# Patient Record
Sex: Female | Born: 1970 | Race: White | Hispanic: No | Marital: Married | Smoking: Former smoker
Health system: Southern US, Community
[De-identification: ages and names within clinical notes are randomized; demographics above are authoritative.]

## PROBLEM LIST (undated history)

## (undated) DIAGNOSIS — T18128A Food in esophagus causing other injury, initial encounter: Secondary | ICD-10-CM

## (undated) DIAGNOSIS — T7840XA Allergy, unspecified, initial encounter: Secondary | ICD-10-CM

## (undated) HISTORY — PX: BREAST BIOPSY: SHX20

## (undated) HISTORY — DX: Allergy, unspecified, initial encounter: T78.40XA

## (undated) HISTORY — PX: WISDOM TOOTH EXTRACTION: SHX21

## (undated) HISTORY — DX: Food in esophagus causing other injury, initial encounter: T18.128A

---

## 1999-04-22 ENCOUNTER — Other Ambulatory Visit: Admission: RE | Admit: 1999-04-22 | Discharge: 1999-04-22 | Payer: Self-pay | Admitting: Obstetrics and Gynecology

## 1999-08-07 ENCOUNTER — Other Ambulatory Visit: Admission: RE | Admit: 1999-08-07 | Discharge: 1999-08-07 | Payer: Self-pay | Admitting: Obstetrics & Gynecology

## 2000-02-26 ENCOUNTER — Inpatient Hospital Stay (HOSPITAL_COMMUNITY): Admission: AD | Admit: 2000-02-26 | Discharge: 2000-03-03 | Payer: Self-pay | Admitting: Obstetrics and Gynecology

## 2000-08-16 ENCOUNTER — Other Ambulatory Visit: Admission: RE | Admit: 2000-08-16 | Discharge: 2000-08-16 | Payer: Self-pay | Admitting: Obstetrics and Gynecology

## 2001-05-09 ENCOUNTER — Inpatient Hospital Stay (HOSPITAL_COMMUNITY): Admission: AD | Admit: 2001-05-09 | Discharge: 2001-05-12 | Payer: Self-pay | Admitting: Obstetrics and Gynecology

## 2001-06-01 ENCOUNTER — Encounter: Admission: RE | Admit: 2001-06-01 | Discharge: 2001-07-01 | Payer: Self-pay | Admitting: Obstetrics and Gynecology

## 2001-08-24 ENCOUNTER — Other Ambulatory Visit: Admission: RE | Admit: 2001-08-24 | Discharge: 2001-08-24 | Payer: Self-pay | Admitting: Obstetrics and Gynecology

## 2002-09-07 ENCOUNTER — Other Ambulatory Visit: Admission: RE | Admit: 2002-09-07 | Discharge: 2002-09-07 | Payer: Self-pay | Admitting: *Deleted

## 2003-10-29 ENCOUNTER — Other Ambulatory Visit: Admission: RE | Admit: 2003-10-29 | Discharge: 2003-10-29 | Payer: Self-pay | Admitting: Obstetrics and Gynecology

## 2005-03-23 ENCOUNTER — Other Ambulatory Visit: Admission: RE | Admit: 2005-03-23 | Discharge: 2005-03-23 | Payer: Self-pay | Admitting: Obstetrics and Gynecology

## 2006-05-24 ENCOUNTER — Other Ambulatory Visit: Admission: RE | Admit: 2006-05-24 | Discharge: 2006-05-24 | Payer: Self-pay | Admitting: Obstetrics and Gynecology

## 2008-03-02 DIAGNOSIS — D229 Melanocytic nevi, unspecified: Secondary | ICD-10-CM

## 2008-03-02 HISTORY — DX: Melanocytic nevi, unspecified: D22.9

## 2009-04-08 ENCOUNTER — Emergency Department (HOSPITAL_BASED_OUTPATIENT_CLINIC_OR_DEPARTMENT_OTHER): Admission: EM | Admit: 2009-04-08 | Discharge: 2009-04-08 | Payer: Self-pay | Admitting: Emergency Medicine

## 2009-04-08 ENCOUNTER — Ambulatory Visit: Payer: Self-pay | Admitting: Diagnostic Radiology

## 2010-08-22 IMAGING — CR DG FOOT COMPLETE 3+V*L*
3 series · 3 of 3 positions shown · non-contrast
Comparison: None

CLINICAL DATA: Left foot pain

LEFT FOOT - COMPLETE 3+ VIEW

[t foot ap left]
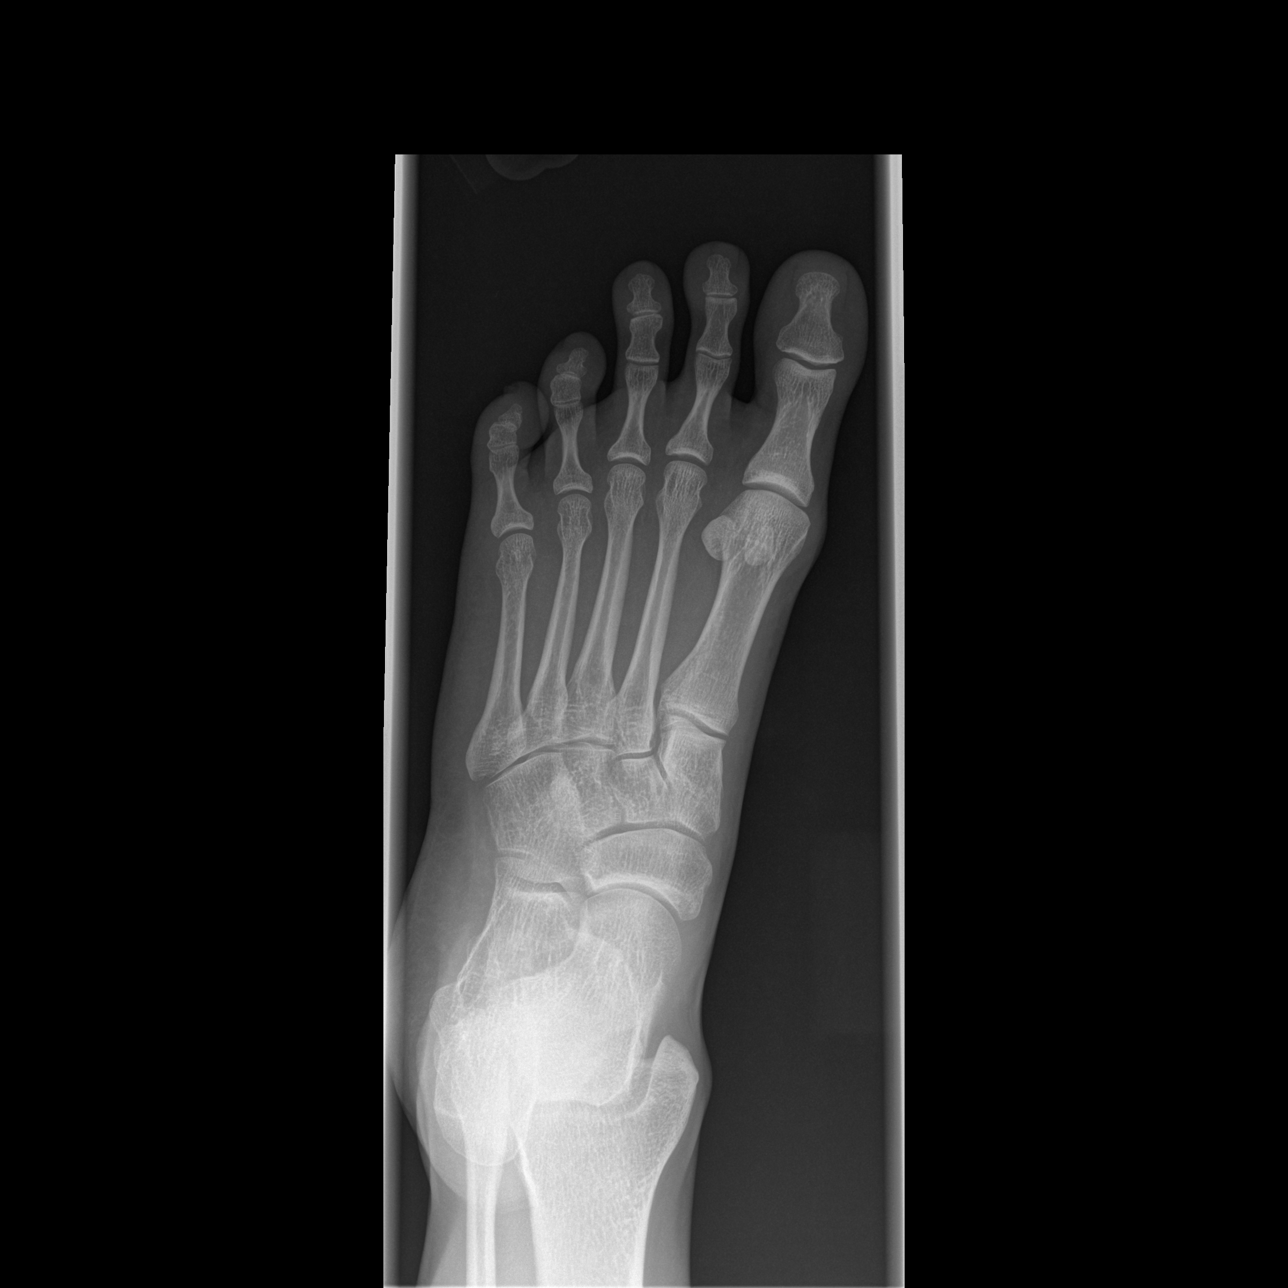

[t foot oblique left]
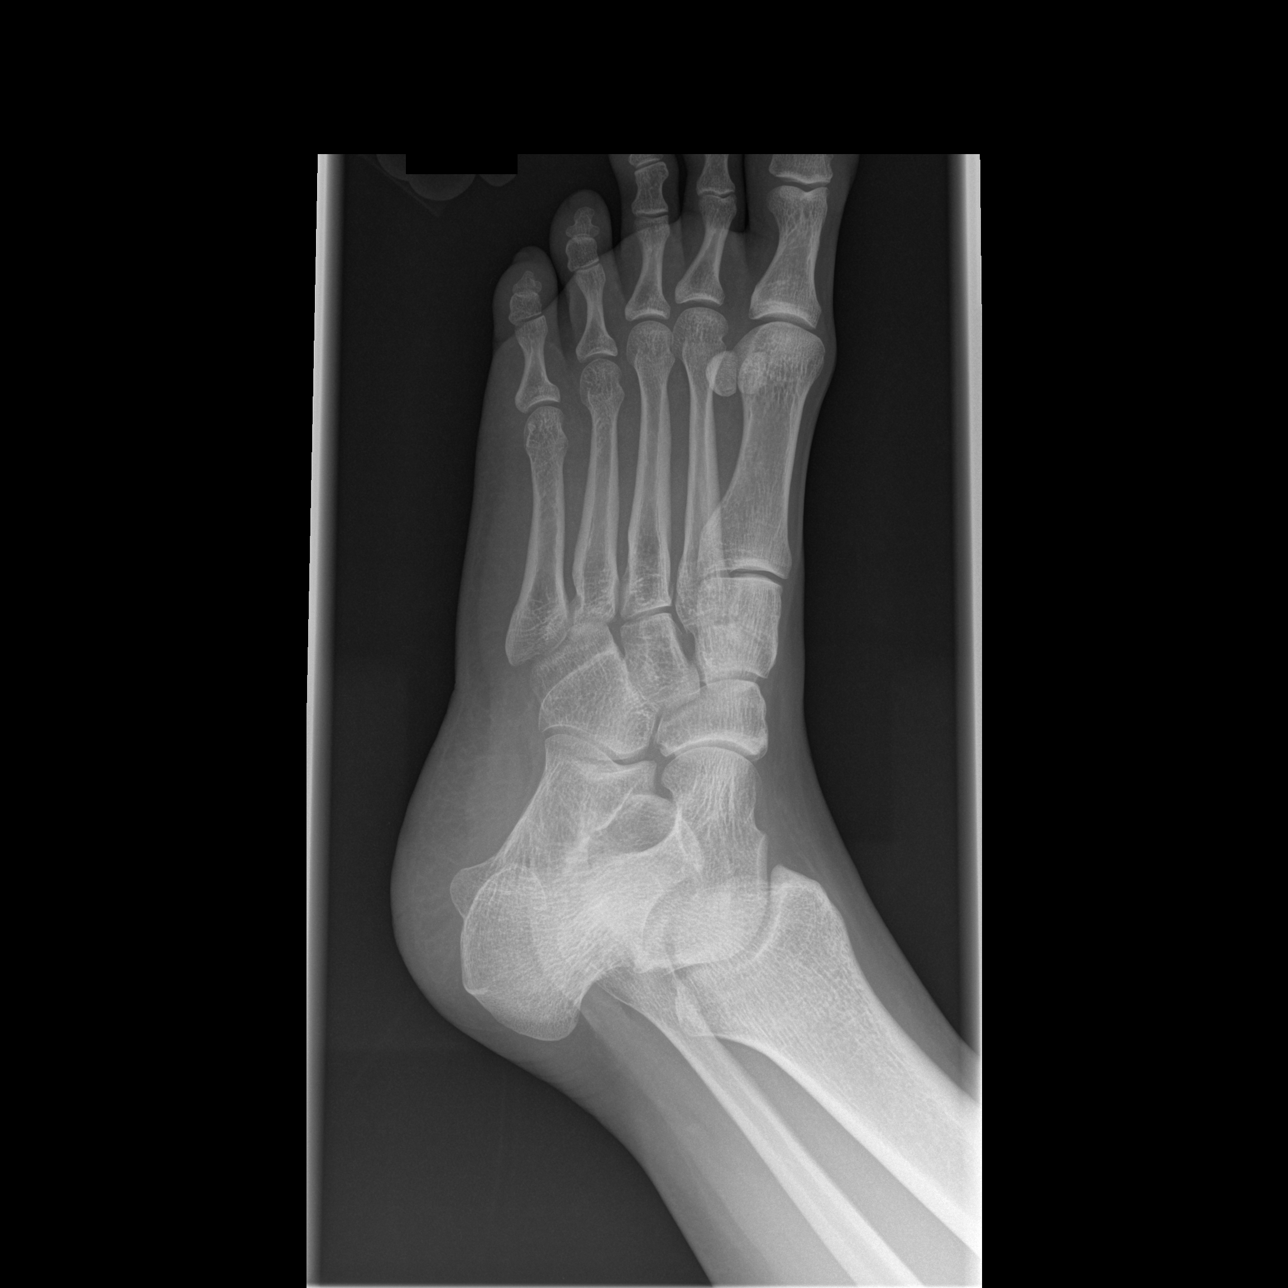

[t foot lat left]
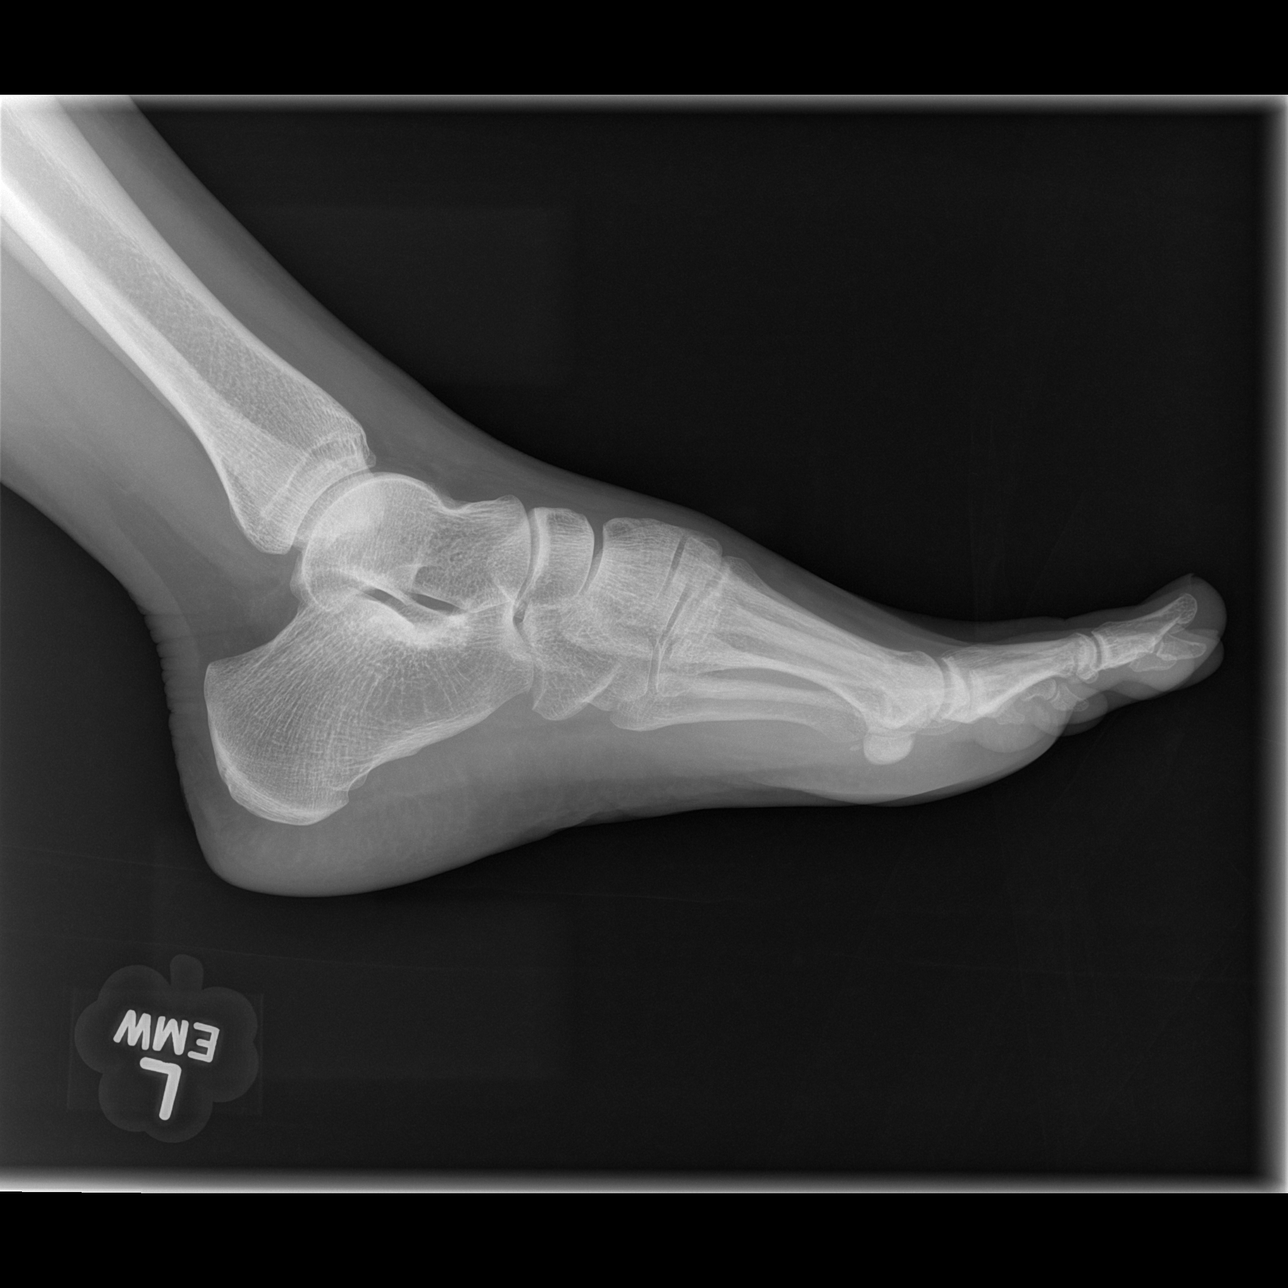

[3 of 3 positions shown; findings below may reference images not displayed]

FINDINGS: Three views of the left foot submitted.
No acute fracture or subluxation.  No radiopaque foreign body.
IMPRESSION: No acute fracture or subluxation.

## 2010-12-26 NOTE — H&P (Signed)
[facility]  Patient:    Rachael Richardson, Rachael Richardson Visit Number: [id number] MRN: [id number]          Service Type: Attending:  Janine Limbo, M.D. Dictated by:   Janine Limbo, M.D. Adm. Date:  05/09/01                           History and Physical  HISTORY OF PRESENT ILLNESS:   The patient is a 40 year old female, gravida 2, para 1-0-0-1, who presents for a repeat cesarean section at [redacted] weeks gestation.  The patient has been followed at [facility] with this pregnancy that has been complicated by a previous cesarean section that was low transverse.  She also has had _______ electrosurgical excision procedure.  She has done well during this pregnancy. She has had a prior diagnosis of beta strep.  She is a cigarette smoker, and she smokes one-pack of cigarettes each day.  OBSTETRICAL HISTORY:          In July of 2001, the patient had a low transverse cesarean section because of a nonreassuring fetal heart rate tracing.  She was at term.  That infant weighed 6 pounds and 7 ounces.  It was a female.  PAST MEDICAL HISTORY:         The patient denies hypertension and diabetes.  DRUG ALLERGIES:               No known drug allergies.  SOCIAL HISTORY:               The patient smokes one-pack of cigarettes each day.  She denies alcohol use and other recreational drug uses.  REVIEW OF SYSTEMS:            Normal pregnancy complaints.  FAMILY HISTORY:               Noncontributory.  PHYSICAL EXAMINATION:  VITAL SIGNS:                  Weight is 141 pounds.  HEENT:                        Within normal limits.  CHEST:                        Clear.  HEART:                        Regular rate and rhythm.  BREASTS:                      Without masses.  ABDOMEN:                      Gravid with a fundal height of 34 cm.  EXTREMITIES:                  Within normal limits.  NEUROLOGICAL EXAMINATION:     Within  normal limits.  PELVIC EXAMINATION:           Cervix is closed and long.  LABORATORY VALUES:            Blood type is A-positive.  Antibody screen negative.  VDRL nonreactive.  Rubella immune.  HBsAg negative.  HIV nonreactive.  Pap is within normal limits.  Alpha fetoprotein screen is within normal limits.  Glucola screen was  121.  ASSESSMENT:                   1. A [redacted] week gestation.                               2. Prior cesarean section.                               3. Desires repeat cesarean section.                               4. Prior history of a positive beta strep                                  culture.                               5. Cigarette smoker.  PLAN:                         The patient will undergo a repeat low transverse cesarean section.  She understands the indications for her procedure and she accepts the risks of, but not limited to anesthetic complications, bleeding, infection, and possible damage to the surrounding organs. Dictated by:   Janine Limbo, M.D. Attending:  Janine Limbo, M.D. DD:  05/08/01 TD:  05/08/01 Job: [phone] [id number]

## 2010-12-26 NOTE — H&P (Signed)
[facility] of [facility]  Patient:    Rachael Richardson, Rachael Richardson                   MRN: [id number] Adm. Date:  [id number] Attending:  Pleas Koch Dictator:   Mack Guise, C.N.M.                         History and Physical  HISTORY OF PRESENT ILLNESS:   In brief, Rachael Richardson is a 40 year old gravida 1, para 0 at 41-5/7ths weeks; EDD 02/14/00, by LMP dates, confirmed with pregnancy ultrasonography.  She presents today for induction of labor due to postdates.  She reports positive fetal movement; no cramping, contractions or pain; no bleeding or rupture of membranes.  Denies any headache, visual changes or epigastric pain.  Her vaginal exam in the office one week ago found her cervix to be long, thick and closed and presenting vertex high.  Her pregnancy has been followed by the C.N.M. service at [facility] and is remarkable for: 1. A history of abnormal Pap smear/LEEP procedure.  2. A smoker. 3. Group B strep positive.  This patient was initially evaluated at the office of [facility] on 07/15/99, at approximately [redacted] weeks gestation as determined by pregnancy ultrasonography.  Her pregnancy has been unremarkable, and throughout the prenatal course the growth of the uterine fundus has been noted to be compatible in size with her dates.  Systolic blood pressure has ranged from 110-120, diastolic blood pressure has ranged from 60-70.  There has been no significant proteinuria.  Prenatal lab work on 08/07/99: Hemoglobin and hematocrit 13.8 and 40.3, platelets 292,000.  Blood type A positive, antibody screen negative.  Toxoplasmosis titers negative and negative.  VDRL nonreactive, rubella immune, hepatitis B surface antigen negative, HIV declined, Pap smear within normal limits, GC and Chlamydia negative.  On 09/04/99, AFP/free beta-hCG within normal range.  At 28 weeks on 11/05/99, a one-hour glucose tolerance 85 and hemoglobin 12.1.  At 36 weeks culture of the vaginal tract is  positive for group B strep.  MEDICAL HISTORY:              History of abnormal Pap and LEEP procedure four years ago.  Pap smears have been normal since that time.  FAMILY HISTORY:               Mom with a history of varicose veins.  Maternal grandmother diabetic.  Paternal grandfather cancer in leg.  SURGICAL HISTORY:             She has had her wisdom teeth removed.  GENETIC HISTORY:              Father of the babys brother born without epiglottis.  ALLERGIES:                    No known drug allergies.  HABITS:                       The patient smokes approximately one pack of cigarettes per day, and denies the use of alcohol or illicit drugs.  SOCIAL HISTORY:               Rachael Richardson is a 40 year old Caucasian female. Her husband Coralie Stanke is involved and supportive.  They are of the [place] faith.  REVIEW OF SYSTEMS:            There are no signs or symptoms suggestive  of focal or systemic disease, and the patient is typical I with the uterine pregnancy at term/postdates with no labor.  OBJECTIVE DATA:  VITAL SIGNS:                  Stable, afebrile.  Fetal heart rate is 120s with average long-term variability, reactivity is present with no periodic changes. The patient is contracting very mildly and irregularly, in no pattern.  PELVIC:                       Her cervix is found to be no long, thick, closed and posterior.  The vertex is high and ballottable.  Cytotec 25 mcg was placed per vagina.  HEENT:                        Unremarkable.  LUNGS:                        Clear.  HEART:                        Regular rate and rhythm.  ABDOMEN:                      Gravida and there is contour of uterine fundus is noted to extend 40 cm above the level of the pubic symphysis, fetal longitudinal lie, cephalic presentation and estimated fetal weight is 7.5-8 pounds. ASSESSMENT:                   Intrauterine pregnancy at 41-4/5ths weeks. Induction of labor for  postdates.  PLAN:                         Admit for induction of labor per Dr. Trudie Reed.  Routine C.N.M. orders.  Cytotec 25 mcg placed per vagina.  The risks, benefits, side effects, alternatives of this medication including the increased risk of C-section with induction of labor and the possibility of hyperstimulation of the uterus and fetal distress were discussed.  The patient and her husband verbalize understanding and their wish to proceed. DD:  02/26/00 TD:  02/27/00 Job: [id number]

## 2010-12-26 NOTE — Op Note (Signed)
[facility] of [facility]  Patient:    RUT, BETTERTON Visit Number: [id number] MRN: [id number]          Service Type: OBS Location: 910B 9198 02 Attending Physician:  Leonard Schwartz Dictated by:   Janine Limbo, M.D. Proc. Date: 05/09/01 Admit Date:  05/09/2001                             Operative Report  PREOPERATIVE DIAGNOSES:       1. Term intrauterine pregnancy.                               2. Prior cesarean section.                               3. Desires repeat cesarean section.  POSTOPERATIVE DIAGNOSES:      1. Term intrauterine pregnancy.                               2. Prior cesarean section.                               3. Desires repeat cesarean section.  PROCEDURE:                    Repeat low transverse cesarean section.  SURGEON:                      Janine Limbo, M.D.  ASSISTANT:                    Nigel Bridgeman, C.N.M.  ANESTHESIA:                   Spinal.  DISPOSITION:                  Ms. Altice is a 40 year old female, gravida 2, para 1-0-0-1, who presents at term for a repeat cesarean section. The patient carefully considered her options for a vaginal birth after cesarean section. She also carefully considered the risk and benefits associated with a repeat low transverse cesarean section. After weighing each of these options, she has decided to proceed with repeat cesarean section. She understands the specific risks associated with this procedure including, but not limited to, anesthetic complications, bleeding, infections, and possible damage to the surrounding organs.  FINDINGS:                     A 6-pound 6-ounce female Denny Peon) was delivered from a cephalic presentation. The Apgars were 9 at one minute and 9 at five minutes. The uterus, fallopian tubes, and ovaries were normal for the gravid state.  ESTIMATED BLOOD LOSS:         700 cc.  DESCRIPTION OF PROCEDURE:     The patient was taken to  the operating room where a spinal anesthetic was given. The patients abdomen, perineum, and outer vagina were prepped with multiple layers of Betadine. A Foley catheter was placed in the bladder. The patient was sterilely draped. A low transverse incision was made in the lower abdomen removing the prior scar. The incision was extended through the subcutaneous tissue, the fascia, and the anterior  peritoneum. An incision was made in the lower uterine segment and extended transversely. The fetal head was delivered without difficulty. The mouth and nose were suctioned. The infant was delivered. The cord was clamped and cut and the infant was handed to the awaiting pediatric team. Routine cord blood studies were obtained. The placenta was removed. The uterine cavity was cleaned of amniotic fluid, clotted blood, and membranes. The uterine incision was closed using a running locking suture of 2-0 Vicryl. Hemostasis was adequate. The pericolonic gutters were irrigated. The anterior peritoneum and the abdominal musculature were reapproximated in the midline using 2-0 Vicryl.  The fascia was irrigated. Hemostasis was adequate. The fascia was closed using a running suture of O Vicryl followed by three interrupted sutures of O Vicryl. The subcutaneous layer was closed using a running suture of 2-0 Vicryl. Hemostasis was adequate. The skin was reapproximated using skin staples. Sponge, needle, and instrument counts were correct on two occasions. The estimated blood loss was 700 cc. The patient tolerated her procedure well. She was taken to the recovery room in stable condition. Dictated by:   Janine Limbo, M.D. Attending Physician:  Leonard Schwartz DD:  05/09/01 TD:  05/09/01 Job: [phone] [id number]

## 2010-12-26 NOTE — Discharge Summary (Signed)
[facility]  Patient:    Rachael Richardson, Rachael Richardson Visit Number: [id number] MRN: [id number]          Service Type: OBS Location: 910A 9135 01 Attending Physician:  Leonard Schwartz Dictated by:   Nigel Bridgeman, C.N.M. Admit Date:  05/09/2001 Discharge Date: 05/12/2001                             Discharge Summary  ADMITTING DIAGNOSES:          1. Intrauterine pregnancy at term.                               2. Previous cesarean section.                               3. Desires repeat.  DISCHARGE DIAGNOSES:          1. Intrauterine pregnancy at term.                               2. Previous cesarean section.                               3. Desires repeat.  PROCEDURES:                   1. Repeat low transverse cesarean section.                               2. Spinal anesthesia.  HOSPITAL COURSE:              Ms. Turbyfill is a 40 year old, gravida 2, para 1-0-0-1, at 34 weeks who presented for scheduled repeat cesarean section. Pregnancy had been remarkable for (1) previous cesarean section for fetal distress with desire for repeat, (2) history of LEEP procedure, (3) previous positive group B strep, and (4) patient is a smoker. The patient was taken to the operating room where a repeat low transverse cesarean section was performed by Dr. Stefano Gaul under spinal anesthesia. Findings were a viable female by the name of Erin, weight 6 pounds 6 ounces, Apgars were 9 and 9. There were normal uterus, tubes, and ovaries noted. Estimated blood loss was 700 cc. The patient tolerated the procedure well and was taken to the recovery room in good condition. The infant was taken to the full-term nursery.  By postoperative day #1, the patient was doing well. She was breast-feeding. She was undecided regarding contraception, although she was considering vasectomy long term. Hemoglobin was 10.4 on day #1. The rest of her hospital course was within normal  limits. By day #3 she was doing well. She was up ad lib and tolerating a regular diet. Infant was also feeding well. She was deemed to have received full benefit of her hospital stay and was discharged home.  DISCHARGE INSTRUCTIONS:       Discharge instructions are per [facility] handout.  DISCHARGE MEDICATIONS:        1. Motrin 600 mg p.o. q.6h. p.r.n. pain.  2. Tylox one to two p.o. q.3-4h. p.r.n. pain.  DISCHARGE FOLLOWUP:           Follow up will occur in six weeks at [facility] or p.r.n. Dictated by:   Nigel Bridgeman, C.N.M. Attending Physician:  Leonard Schwartz DD:  05/12/01 TD:  05/12/01 Job: [id number]

## 2010-12-26 NOTE — Discharge Summary (Signed)
[facility]  Patient:    Rachael Richardson, Rachael Richardson                   MRN: [id number] Adm. Date:  [id number] Disc. Date: [id number] Attending:  Pleas Koch Dictator:   Wynelle Bourgeois, C.N.M.                           Discharge Summary  DATE OF BIRTH:                1970/12/24.  ADMISSION DIAGNOSES:          1. Intrauterine pregnancy at 41-5/7 weeks.                               2. Induction of labor for post dates.  DISCHARGE DIAGNOSES:          1. Intrauterine pregnancy at 41-5/7 weeks.                               2. Induction of labor for post dates.                               3. Status post primary low transverse cesarean                                  section for a female infant named Logan, weight                                  6 pounds 7 ounces.  Apgars 8 and 9.                               4. Fibroid, 2 cm.  HOSPITAL COURSE:              The patient is a G1, P0 at 41-5/7 weeks who was admitted on June 19 for induction of labor related to post dates.  Her pregnancy had been followed by the CNM service.  She had a history remarkable for a history of abnormal Pap with LEEP, smoker and GBS positive.  Upon admission, her cervix was long, thick and closed, posterior with the vertex high and ballottable.  She had Cytotec 25 mcg placed in her vagina for cervical ripening.  On July 20, she was making slow progress with softening of the cervix, 50% effacement and still only fingertip dilation.  Later that evening, she continued with Pitocin induction with mild uterine contractions. The Pitocin was turned off on the evening of July 20 and restarted again at 6:00 a.m. on July 21.  She progressed with mild uterine contractions and reactive fetal heart rate tracing with cervical exam changing to 2 cm, 95% effaced and -1 station at 4:30 p.m. on July 21.  Her floor waters were ruptured for clear fluid and an epidural was placed for analgesia.   She progressed through the day with inadequate contractions and achieved 5 cm of dilation by 7:25 p.m. on February 28, 2000, with continue inadequate uterine contractions and development of deep variable decelerations with late return to baseline.  Dr. Stefano Gaul was consulted at that time, and the decision was made for a cesarean delivery of the infant, which was done by Dr. Stefano Gaul at 1949 hours on February 28, 2000 of a viable female infant named Paediatric nurse.  Apgars were 8 and 9.  Weight was 6 pounds 7 ounces.  Three-vessel cord, normal placenta and a 2 cm fibroid was identified during the surgery.  EBL was 600 cc.  She did well during her postoperative course and, on postoperative day #3, her infant was having episodes of respiratory instability and required further hospital observation.  The patient elected to stay in the hospital one extra day.  On postoperative day #4, her lungs were clear.  Her heart was regular rate and rhythm.  Her incision was clean, dry and intact with Steri-Strips. Lochia was small.  The extremities had trace edema with negative Homans and her baby was doing better.  Therefore, she was deemed to have received the full benefit of her hospital stay by postoperative day #4 (March 03, 2000), and was discharged to home on that day.  DISCHARGE MEDICATIONS:        Motrin, Tylox and prenatal vitamins.  DISCHARGE LABORATORY DATA:    White blood cell count 12.8, hemoglobin 12.2, hematocrit 34.8, platelet count 134.  DISCHARGE INSTRUCTIONS;       Per [facility] handout.  DISCHARGE FOLLOW UP:          Six weeks at [facility]. DD:  03/03/00 TD:  03/05/00 Job: [id number]

## 2010-12-26 NOTE — Op Note (Signed)
[facility] of [facility]  Patient:    Rachael Richardson, Rachael Richardson                   MRN: [id number] Proc. Date: 02/28/00 Adm. Date:  [id number] Attending:  Pleas Koch                           Operative Report  PREOPERATIVE DIAGNOSES:       1. A 42-week gestation.                               2. Nonreassuring fetal heart rate tracing.  POSTOPERATIVE DIAGNOSES:      1. A 42-week gestation.                               2. Nonreassuring fetal heart rate tracting.                               3. Fibroid uterus.                               4. Shoulder cord around the baby.  OPERATION:                    Primary low transverse cesarean section.  SURGEON:                      Janine Limbo, M.D.  ASSISTANT:                    Vance Gather Duplantis, C.N.M.  ANESTHESIA:                   Epidural.  INDICATIONS:                  The patient is a 40 year old female, gravida 1, para 0, who presented at 42-weeks gestation for induction of labor.  She was given Cytotec on February 26, 2000, followed by Pitocin on July 20 and February 28, 2000.  The patient ruptured her membranes earlier today.  She began to labor. She began having deep decelerations with her contractions.  The patient was given an amnioinfusion, positioned from side-to-side, given a fluid bolus, and given oxygen.  Deep decelerations did not resolve.  A cesarean delivery was recommended and accepted.  The patient and her husband understood the indications for her procedure and they accepted the risks of, but not limited to, anesthetic complications, bleeding, infection, and possible damage to the surrounding organs.  FINDINGS:                     A 6 pound and 7 ounce female infant Whitney Post) was delivered from a left occiput transverse position.  There was a shoulder cord present.  The Apgars were 8 at one minute and 9 at five minutes.  There was a 2 cm fibroid on the posterior right ________.  The placenta was  normal. There was a three vessel cord present.  The fallopian tubes and ovaries were normal.  DESCRIPTION OF PROCEDURE:     The patient was taken to the operating room where it was determined that the epidural she had received for labor would be adequate  for cesarean delivery.  A Foley catheter had previously been placed. The patients abdomen was prepped with multiple layers of Betadine and then sterilely draped.  A low transverse incision was made in the abdomen and carried sharply through the subcutaneous tissue, the fascia, and the anterior peritoneum.  An incision was made in the lower uterine segment and extended transversely.  The fetal head was delivered.  The mouth and nose were suctioned.  The shoulder cord was reduced.  The remainder of the infant was delivered.  The cord was clamped and cut.  The infant was handed to the waiting pediatric team.  Routine cord blood studies were obtained.  The placenta was removed.  The uterine cavity was cleaned of amniotic fluid, clotted blood, and membranes.  The uterine incision was closed using a running locking suture of 2-0 Vicryl.  The pericolonic gutters were cleaned of amniotic fluid and clotted blood.  Copious irrigation was accomplished. Hemostasis was adequate.                                The anterior peritoneum and the abdominal musculature were reapproximated in the midline using interrupted sutures of 2-0 Vicryl.  The fascia and the abdominal musculature were irrigated and hemostasis was adequate.  The fascia was closed using a running suture of 0 Vicryl followed by three interrupted sutures of 0 Vicryl.  The subcutaneous layer was closed using a running suture of 2-0 Vicryl.  The skin was reapproximated using skin staples.                                Sponge count, needle count, and instrument counts were correct on two occasions.  The estimated blood loss was 600 cc. The patient tolerated her procedure well.  The infant  was taken to the full-term nursery in stable condition.  The patient was taken to the recovery room in stable condition.  She was noted to drain clear yellow urine at the end of her procedure. DD:  02/28/00 TD:  03/02/00 Job: [id number]

## 2013-12-26 DIAGNOSIS — C4491 Basal cell carcinoma of skin, unspecified: Secondary | ICD-10-CM

## 2013-12-26 HISTORY — DX: Basal cell carcinoma of skin, unspecified: C44.91

## 2015-07-29 ENCOUNTER — Other Ambulatory Visit: Payer: Self-pay | Admitting: Obstetrics and Gynecology

## 2015-07-29 DIAGNOSIS — D241 Benign neoplasm of right breast: Secondary | ICD-10-CM

## 2015-08-08 ENCOUNTER — Ambulatory Visit
Admission: RE | Admit: 2015-08-08 | Discharge: 2015-08-08 | Disposition: A | Discharge status: Home / Self Care | Payer: 59 | Source: Ambulatory Visit | Attending: Obstetrics and Gynecology | Admitting: Obstetrics and Gynecology

## 2015-08-08 DIAGNOSIS — D241 Benign neoplasm of right breast: Secondary | ICD-10-CM

## 2016-09-28 ENCOUNTER — Other Ambulatory Visit: Payer: Self-pay | Admitting: Obstetrics and Gynecology

## 2016-09-28 DIAGNOSIS — D241 Benign neoplasm of right breast: Secondary | ICD-10-CM

## 2016-10-09 ENCOUNTER — Ambulatory Visit
Admission: RE | Admit: 2016-10-09 | Discharge: 2016-10-09 | Disposition: A | Discharge status: Home / Self Care | Payer: 59 | Source: Ambulatory Visit | Attending: Obstetrics and Gynecology | Admitting: Obstetrics and Gynecology

## 2016-10-09 ENCOUNTER — Encounter: Payer: Self-pay | Admitting: Radiology

## 2016-10-09 ENCOUNTER — Other Ambulatory Visit: Payer: Self-pay | Admitting: Obstetrics and Gynecology

## 2016-10-09 DIAGNOSIS — D241 Benign neoplasm of right breast: Secondary | ICD-10-CM

## 2016-10-09 DIAGNOSIS — N6489 Other specified disorders of breast: Secondary | ICD-10-CM

## 2016-10-14 ENCOUNTER — Other Ambulatory Visit: Payer: Self-pay | Admitting: Obstetrics and Gynecology

## 2016-10-14 DIAGNOSIS — N6489 Other specified disorders of breast: Secondary | ICD-10-CM

## 2016-10-16 ENCOUNTER — Ambulatory Visit
Admission: RE | Admit: 2016-10-16 | Discharge: 2016-10-16 | Disposition: A | Discharge status: Home / Self Care | Payer: 59 | Source: Ambulatory Visit | Attending: Obstetrics and Gynecology | Admitting: Obstetrics and Gynecology

## 2016-10-16 DIAGNOSIS — N6489 Other specified disorders of breast: Secondary | ICD-10-CM

## 2017-07-28 ENCOUNTER — Other Ambulatory Visit: Payer: Self-pay | Admitting: Dermatology

## 2017-10-15 ENCOUNTER — Emergency Department (HOSPITAL_COMMUNITY)
Admission: EM | Admit: 2017-10-15 | Discharge: 2017-10-15 | Disposition: A | Discharge status: Home / Self Care | Payer: 59 | Attending: Emergency Medicine | Admitting: Emergency Medicine

## 2017-10-15 ENCOUNTER — Emergency Department (HOSPITAL_COMMUNITY): Payer: 59

## 2017-10-15 ENCOUNTER — Other Ambulatory Visit: Payer: Self-pay

## 2017-10-15 ENCOUNTER — Encounter (HOSPITAL_COMMUNITY): Payer: Self-pay

## 2017-10-15 ENCOUNTER — Telehealth: Payer: Self-pay

## 2017-10-15 ENCOUNTER — Encounter (HOSPITAL_COMMUNITY)
Admission: EM | Disposition: A | Discharge status: Home / Self Care | Payer: Self-pay | Source: Home / Self Care | Attending: Emergency Medicine

## 2017-10-15 DIAGNOSIS — K222 Esophageal obstruction: Secondary | ICD-10-CM | POA: Diagnosis not present

## 2017-10-15 DIAGNOSIS — K2980 Duodenitis without bleeding: Secondary | ICD-10-CM | POA: Insufficient documentation

## 2017-10-15 DIAGNOSIS — K210 Gastro-esophageal reflux disease with esophagitis: Secondary | ICD-10-CM | POA: Insufficient documentation

## 2017-10-15 DIAGNOSIS — T18128A Food in esophagus causing other injury, initial encounter: Secondary | ICD-10-CM | POA: Diagnosis present

## 2017-10-15 DIAGNOSIS — K2970 Gastritis, unspecified, without bleeding: Secondary | ICD-10-CM | POA: Insufficient documentation

## 2017-10-15 DIAGNOSIS — X58XXXA Exposure to other specified factors, initial encounter: Secondary | ICD-10-CM | POA: Diagnosis not present

## 2017-10-15 HISTORY — DX: Food in esophagus causing other injury, initial encounter: T18.128A

## 2017-10-15 HISTORY — PX: ESOPHAGOGASTRODUODENOSCOPY: SHX5428

## 2017-10-15 LAB — BASIC METABOLIC PANEL
ANION GAP: 11 (ref 5–15)
BUN: 18 mg/dL (ref 6–20)
CALCIUM: 9.5 mg/dL (ref 8.9–10.3)
CO2: 25 mmol/L (ref 22–32)
Chloride: 113 mmol/L — ABNORMAL HIGH (ref 101–111)
Creatinine, Ser: 0.88 mg/dL (ref 0.44–1.00)
GFR calc Af Amer: >60 mL/min (ref >60)
GLUCOSE: 112 mg/dL — AB (ref 65–99)
Potassium: 3.6 mmol/L (ref 3.5–5.1)
Sodium: 149 mmol/L — ABNORMAL HIGH (ref 135–145)

## 2017-10-15 LAB — CBC
HCT: 44.8 % (ref 36.0–46.0)
Hemoglobin: 15.4 g/dL — ABNORMAL HIGH (ref 12.0–15.0)
MCH: 33.3 pg (ref 26.0–34.0)
MCHC: 34.4 g/dL (ref 30.0–36.0)
MCV: 96.8 fL (ref 78.0–100.0)
PLATELETS: 342 10*3/uL (ref 150–400)
RBC: 4.63 MIL/uL (ref 3.87–5.11)
RDW: 13.5 % (ref 11.5–15.5)
WBC: 9 10*3/uL (ref 4.0–10.5)

## 2017-10-15 LAB — LIPASE, BLOOD: Lipase: 28 U/L (ref 11–51)

## 2017-10-15 SURGERY — EGD (ESOPHAGOGASTRODUODENOSCOPY)
Anesthesia: Moderate Sedation

## 2017-10-15 MED ORDER — SODIUM CHLORIDE 0.9 % IV SOLN
1000.0000 mL | INTRAVENOUS | Status: DC
  Administered 2017-10-15: 1000 mL via INTRAVENOUS

## 2017-10-15 MED ORDER — MORPHINE SULFATE (PF) 4 MG/ML IV SOLN
4.0000 mg | Freq: Once | INTRAVENOUS | Status: DC
  Filled 2017-10-15: qty 1

## 2017-10-15 MED ORDER — FENTANYL CITRATE (PF) 100 MCG/2ML IJ SOLN
INTRAMUSCULAR | Status: AC
  Filled 2017-10-15: qty 2

## 2017-10-15 MED ORDER — SODIUM CHLORIDE 0.9 % IV BOLUS (SEPSIS)
1000.0000 mL | Freq: Once | INTRAVENOUS | Status: AC
  Administered 2017-10-15: 1000 mL via INTRAVENOUS

## 2017-10-15 MED ORDER — FENTANYL CITRATE (PF) 100 MCG/2ML IJ SOLN
INTRAMUSCULAR | Status: DC | PRN
  Administered 2017-10-15 (×4): 25 ug via INTRAVENOUS

## 2017-10-15 MED ORDER — MIDAZOLAM HCL 5 MG/ML IJ SOLN
INTRAMUSCULAR | Status: AC
  Filled 2017-10-15: qty 2

## 2017-10-15 MED ORDER — PANTOPRAZOLE SODIUM 40 MG PO TBEC: 40.0000 mg | DELAYED_RELEASE_TABLET | Freq: Every day | ORAL | 0 refills | Status: DC

## 2017-10-15 MED ORDER — BUTAMBEN-TETRACAINE-BENZOCAINE 2-2-14 % EX AERO
INHALATION_SPRAY | CUTANEOUS | Status: DC | PRN
  Administered 2017-10-15: 1 via TOPICAL

## 2017-10-15 MED ORDER — PANTOPRAZOLE SODIUM 40 MG IV SOLR
40.0000 mg | Freq: Once | INTRAVENOUS | Status: AC
  Administered 2017-10-15: 40 mg via INTRAVENOUS
  Filled 2017-10-15: qty 40

## 2017-10-15 MED ORDER — PROPOFOL 10 MG/ML IV BOLUS
INTRAVENOUS | Status: AC
  Administered 2017-10-15: 70 mg
  Filled 2017-10-15: qty 20

## 2017-10-15 MED ORDER — MIDAZOLAM HCL 10 MG/2ML IJ SOLN
INTRAMUSCULAR | Status: DC | PRN
  Administered 2017-10-15: 2 mg via INTRAVENOUS
  Administered 2017-10-15: 1 mg via INTRAVENOUS
  Administered 2017-10-15 (×2): 2 mg via INTRAVENOUS

## 2017-10-15 NOTE — ED Triage Notes (Signed)
Patient reports that she was eating chicken yesterday and felt like it was stuck. Patient reports that she vomited x 1. Patient reports that she is able to swallow saliva and water. Patient states it always comes back up.

## 2017-10-15 NOTE — ED Notes (Signed)
Bed: WA21 Expected date:  Expected time:  Means of arrival:  Comments: RESA endo procedure will return to this room

## 2017-10-15 NOTE — ED Notes (Signed)
Bed: RESB Expected date:  Expected time:  Means of arrival:  Comments: Endo

## 2017-10-15 NOTE — Op Note (Signed)
[facility] Patient Name: Rachael Richardson Procedure Date: 10/15/2017 MRN: [id number] Attending MD: Jerene Bears , MD Date of Birth: 04-25-1971 CSN: [id number] Age: 47 Admit Type: Emergency Department Procedure:                Upper GI endoscopy Indications:              Foreign body in the esophagus Providers:                Lajuan Lines. Hilarie Fredrickson, MD, Carolynn Comment RN, RN, Charolette Child, Technician Referring MD:             Dorthula Nettles, MD [facility] MD) Medicines:                Fentanyl 100 micrograms IV, Midazolam 7 mg IV,                            Propofol total dose 70 mg IV (propofol given after                            continued patient agitation with fentanyl and                            midazolam) Complications:            No immediate complications. Estimated Blood Loss:     Estimated blood loss: none. Procedure:                Pre-Anesthesia Assessment:                           - Prior to the procedure, a History and Physical                            was performed, and patient medications and                            allergies were reviewed. The patient's tolerance of                            previous anesthesia was also reviewed. The risks                            and benefits of the procedure and the sedation                            options and risks were discussed with the patient.                            All questions were answered, and informed consent                            was obtained. Prior Anticoagulants: The patient has  taken no previous anticoagulant or antiplatelet                            agents. ASA Grade Assessment: I - A normal, healthy                            patient. After reviewing the risks and benefits,                            the patient was deemed in satisfactory condition to                            undergo the procedure.                            After obtaining informed consent, the endoscope was                            passed under direct vision. Throughout the                            procedure, the patient's blood pressure, pulse, and                            oxygen saturations were monitored continuously. The                            Endoscope was introduced through the mouth, and                            advanced to the second part of duodenum. The upper                            GI endoscopy was accomplished without difficulty.                            The patient tolerated the procedure fairly well. Scope In: Scope Out: Findings:      Food was found in the lower third of the esophagus. Removal of food was       accomplished by gently advancing the endoscope around the food bolus and       then gently advancing the food bolus into the stomach.      Moderately severe esophagitis with no bleeding was found at the       gastroesophageal junction. This is secondary to reflux and recent food       impaction. 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      Localized mild inflammation characterized by erythema was found in the       gastric antrum. Somach otherwise normal.      Localized mild inflammation characterized by erosions and erythema was       found in the duodenal bulb.      The second portion of the duodenum was normal. Impression:               - Food in the lower third of the esophagus.  Removal                            was successful.                           - Moderately severe esophagitis at GE junction.                           - Mild gastritis.                           - Mild duodenitis.                           - Normal second portion of the duodenum. Moderate Sedation:      Moderate (conscious) sedation was administered by the endoscopy nurse       and supervised by the endoscopist. The following parameters were       monitored: oxygen saturation, heart rate, blood pressure, and response        to care. Total physician intraservice time was 28 minutes. Recommendation:           - Patient has a contact number available for                            emergencies. The signs and symptoms of potential                            delayed complications were discussed with the                            patient. Return to normal activities tomorrow.                            Written discharge instructions were provided to the                            patient.                           - Soft diet until follow-up EGD.                           - Continue present medications. Begin pantoprazole                            40 mg once daily until repeat EGD.                           - Repeat upper endoscopy at appointment to be                            scheduled (my office will arrange). Procedure Code(s):        --- Professional ---                           [phone],  Esophagogastroduodenoscopy, flexible,                            transoral; with removal of foreign body(s)                           99152, Moderate sedation services provided by the                            same physician or other qualified health care                            professional performing the diagnostic or                            therapeutic service that the sedation supports,                            requiring the presence of an independent trained                            observer to assist in the monitoring of the                            patient's level of consciousness and physiological                            status; initial 15 minutes of intraservice time,                            patient age 45 years or older                           [phone], Moderate sedation services; each additional                            15 minutes intraservice time Diagnosis Code(s):        --- Professional ---                           [phone], Food in esophagus causing other injury,                             initial encounter                           K21.0, Gastro-esophageal reflux disease with                            esophagitis                           K29.70, Gastritis, unspecified, without bleeding                           K29.80, Duodenitis without bleeding  T18.108A, Unspecified foreign body in esophagus                            causing other injury, initial encounter CPT copyright 2016 [employer]. All rights reserved. The codes documented in this report are preliminary and upon coder review may  be revised to meet current compliance requirements. Jerene Bears, MD 10/15/2017 11:21:04 AM This report has been signed electronically. Number of Addenda: 0

## 2017-10-15 NOTE — Telephone Encounter (Signed)
Left her a message on her voicemail requesting she call back and ask to speak with Beth.

## 2017-10-15 NOTE — Telephone Encounter (Signed)
-----   Message from Willia Craze, NP sent at 10/15/2017 11:08 AM EST ----- Rachael Richardson, another food impaction in ED today. Food bolus removed. She needs a repeat EGD to dilate stricture. Please schedule this to be done with Pyrtle sometime in April or May. She doesn't need office visit first. Thanks

## 2017-10-15 NOTE — ED Provider Notes (Signed)
[facility] DEPT Provider Note   CSN: [id number] Arrival date & time: 10/15/17  0830     History   Chief Complaint Chief Complaint  Patient presents with  . foreign body in throat    HPI ADALAI PERL is a 47 y.o. female.  HPI Was eating chicken yesterday around 1 PM.  It felt like it slowed down and got stuck part way down with swallowing.  She reports it was a piece of chicken without any bone in it.  She reports this is happened before but usually things will then continue to move on down.  She reports it just stayed there since yesterday.  She has not been able to drink anything or keep her saliva down.  She reports she is having to throw up clear fluid with no bile in it repeatedly.  Reports it has become extremely painful over the course of the night.  Now she retches and tries to vomit but only clear water liquid comes up.  No fever no chill.  Patient reports she was otherwise well before this happened.  No medical history and no allergies. History reviewed. No pertinent past medical history.  There are no active problems to display for this patient.   History reviewed. No pertinent surgical history.  OB History    No data available       Home Medications    Prior to Admission medications   Not on File    Family History Family History  Problem Relation Age of Onset  . Cancer Mother     Social History Social History   Tobacco Use  . Smoking status: Never Smoker  . Smokeless tobacco: Never Used  Substance Use Topics  . Alcohol use: Yes    Comment: occasionally  . Drug use: No     Allergies   Patient has no known allergies.   Review of Systems Review of Systems 10 Systems reviewed and are negative for acute change except as noted in the HPI.   Physical Exam Updated Vital Signs BP (!) 152/98 (BP Location: Right Arm)   Pulse 98   Temp 97.8 F (36.6 C) (Oral)   Resp 17   Ht 5\' 2"  (1.575 m)   Wt 70.3 kg  (155 lb)   LMP 10/04/2017   SpO2 95%   BMI 28.35 kg/m   Physical Exam  Constitutional: She is oriented to person, place, and time. She appears well-developed and well-nourished.  Patient is alert and appropriate.  Nontoxic.  She has no respiratory distress.  She periodically gets spasmodic pain and wretches bringing up small bolus of clear liquid.  Between episodes she is clinically well in appearance  HENT:  Head: Normocephalic and atraumatic.  Mouth/Throat: Oropharynx is clear and moist.  Eyes: EOM are normal.  Cardiovascular: Normal rate, regular rhythm and normal heart sounds.  Pulmonary/Chest: Effort normal and breath sounds normal.  Abdominal: Soft. She exhibits no distension. There is tenderness. There is no guarding.  Mild epigastric tenderness.  This precipitates feeling of nausea and increased pressure  Musculoskeletal: Normal range of motion.  Neurological: She is alert and oriented to person, place, and time. No cranial nerve deficit. She exhibits normal muscle tone. Coordination normal.  Skin: Skin is warm and dry.  Psychiatric: She has a normal mood and affect.     ED Treatments / Results  Labs (all labs ordered are listed, but only abnormal results are displayed) Labs Reviewed  BASIC METABOLIC PANEL  CBC  EKG  EKG Interpretation None       Radiology No results found.  Procedures Procedures (including critical care time)  Medications Ordered in ED Medications  sodium chloride 0.9 % bolus 1,000 mL (not administered)    Followed by  0.9 %  sodium chloride infusion (not administered)  pantoprazole (PROTONIX) injection 40 mg (not administered)  morphine 4 MG/ML injection 4 mg (not administered)     Initial Impression / Assessment and Plan / ED Course  I have reviewed the triage vital signs and the nursing notes.  Pertinent labs & imaging results that were available during my care of the patient were reviewed by me and considered in my medical  decision making (see chart for details).    Consult: Gastroenterology.  Final Clinical Impressions(s) / ED Diagnoses   Final diagnoses:  Food impaction of esophagus, initial encounter   Patient with esophageal food impaction for approximately 20 hours.  She is otherwise healthy.  No respiratory distress.  Patient has significant associated pain with retching.  Fluids and pain medication started.  Gastroenterology consulted for endoscopy. ED Discharge Orders    None       Charlesetta Shanks, MD 10/15/17 [phone]

## 2017-10-15 NOTE — Consult Note (Signed)
Referring Provider: ED Primary Care Physician:  No primary care provider on file. Primary Gastroenterologist:   None, unassigned  Reason for Consultation:  food impaction   ASSESSMENT AND PLAN:    47 yo female with one year history of intermittent solid food dysphagia, now with probable impaction. Rule out stricture . Rule out eoinophilic esophagitis.   -patient will be scheduled for EGD with disimpaction. The risks and benefits of EGD were discussed and the patient agrees to proceed.  -Further recommendations to follow EGD  HPI: Rachael Richardson is a 47 y.o. female with no significant past medical history.  She gets annual physical done through [facility] for work.  She takes vitamins, herbal supplements and occasional Advil for knee pain.  Patient has been having intermittent solid food dysphagia 1-2 years.  When food gets stuck she usually stands up until the food passes.  Yesterday a piece of chicken became lodged in her esophagus and has not passed.  She has been vomiting clear fluids since.  She is unable to tolerate her own secretions.. She has some chest discomfort.  No shortness of breath.  Patient has no history of GERD.  Her weight is stable.  Bowel movements are normal, no blood in stool.  No other GI or general medical complaints. No family history of GI malignancies    History reviewed. No pertinent past medical history.  History reviewed. No pertinent surgical history.  Prior to Admission medications   Not on File    Current Facility-Administered Medications  Medication Dose Route Frequency Provider Last Rate Last Dose  . sodium chloride 0.9 % bolus 1,000 mL  1,000 mL Intravenous Once Charlesetta Shanks, MD       Followed by  . 0.9 %  sodium chloride infusion  1,000 mL Intravenous Continuous Pfeiffer, Marcy, MD      . morphine 4 MG/ML injection 4 mg  4 mg Intravenous Once Charlesetta Shanks, MD      . pantoprazole (PROTONIX) injection 40 mg  40 mg  Intravenous Once Charlesetta Shanks, MD       No current outpatient medications on file.    Allergies as of 10/15/2017  . (No Known Allergies)    Family History  Problem Relation Age of Onset  . Cancer Mother     Social History   Socioeconomic History  . Marital status: Married    Spouse name: Not on file  . Number of children: Not on file  . Years of education: Not on file  . Highest education level: Not on file  Social Needs  . Financial resource strain: Not on file  . Food insecurity - worry: Not on file  . Food insecurity - inability: Not on file  . Transportation needs - medical: Not on file  . Transportation needs - non-medical: Not on file  Occupational History  . Not on file  Tobacco Use  . Smoking status: Never Smoker  . Smokeless tobacco: Never Used  Substance and Sexual Activity  . Alcohol use: Yes    Comment: occasionally  . Drug use: No  . Sexual activity: Not on file  Other Topics Concern  . Not on file  Social History Narrative  . Not on file    Review of Systems: All systems reviewed and negative except where noted in HPI.  Physical Exam: Vital signs in last 24 hours: Temp:  [97.8 F (36.6 C)] 97.8 F (36.6 C) (03/08 0839) Pulse Rate:  [98] 98 (03/08 0839) Resp:  [  17] 17 (03/08 0839) BP: (152)/(98) 152/98 (03/08 0839) SpO2:  [95 %] 95 % (03/08 0839) Weight:  [155 lb (70.3 kg)] 155 lb (70.3 kg) (03/08 0839)   General:   Alert, well-developed,white female in NAD Psych:  Pleasant, cooperative. Normal mood and affect. Eyes:  Pupils equal, sclera clear, no icterus.   Conjunctiva pink. Ears:  Normal auditory acuity. Nose:  No deformity, discharge,  or lesions. Neck:  Supple; no masses Lungs:  Clear throughout to auscultation.   No wheezes, crackles, or rhonchi.  Heart:  Regular rate and rhythm; no murmurs, no edema Abdomen:  Soft, non-distended, nontender, BS active, no palp mass    Rectal:  Deferred  Msk:  Symmetrical without gross  deformities. . Pulses:  Normal pulses noted. Neurologic:  Alert and  oriented x4;  grossly normal neurologically. Skin:  Intact without significant lesions or rashes..   Intake/Output from previous day: No intake/output data recorded. Intake/Output this shift: No intake/output data recorded.  Lab Results: No results for input(s): WBC, HGB, HCT, PLT in the last 72 hours. BMET No results for input(s): NA, K, CL, CO2, GLUCOSE, BUN, CREATININE, CALCIUM in the last 72 hours. LFT No results for input(s): PROT, ALBUMIN, AST, ALT, ALKPHOS, BILITOT, BILIDIR, IBILI in the last 72 hours. PT/INR No results for input(s): LABPROT, INR in the last 72 hours. Hepatitis Panel No results for input(s): HEPBSAG, HCVAB, HEPAIGM, HEPBIGM in the last 72 hours.  Studies/Results: No results found.   Tye Savoy, NP-C @  10/15/2017, 9:27 AM  Pager number [phone]

## 2017-10-15 NOTE — Progress Notes (Signed)
Patient unable to tolerate moderate sedation. EGD converted to propofol sedation and tolerated much better. Bedside report given to Ubaldo Glassing, Therapist, sports.

## 2017-10-18 ENCOUNTER — Encounter (HOSPITAL_COMMUNITY): Payer: Self-pay | Admitting: Internal Medicine

## 2017-10-20 NOTE — Telephone Encounter (Signed)
Spoke with the patient. She agrees to repeat the EGD to have dilation done, but she wants to wait to schedule a little closer to May. She asks to be recalled. I have put her in for a recall in April.

## 2017-12-24 ENCOUNTER — Encounter: Payer: Self-pay | Admitting: Internal Medicine

## 2018-03-30 ENCOUNTER — Encounter: Payer: Self-pay | Admitting: Internal Medicine

## 2018-05-23 ENCOUNTER — Ambulatory Visit (AMBULATORY_SURGERY_CENTER): Payer: Self-pay | Admitting: *Deleted

## 2018-05-23 VITALS — Ht 62.0 in | Wt 142.0 lb

## 2018-05-23 DIAGNOSIS — K222 Esophageal obstruction: Secondary | ICD-10-CM

## 2018-05-23 NOTE — Progress Notes (Signed)
No egg or soy allergy known to patient  No issues with past sedation with any surgeries  or procedures, no intubation problems  No diet pills per patient No home 02 use per patient  No blood thinners per patient  Pt denies issues with constipation  No A fib or A flutter  EMMI video sent to pt's e mail pt declined   

## 2018-05-24 ENCOUNTER — Encounter: Payer: Self-pay | Admitting: Internal Medicine

## 2018-06-06 ENCOUNTER — Encounter: Payer: Self-pay | Admitting: Internal Medicine

## 2018-06-06 ENCOUNTER — Ambulatory Visit (AMBULATORY_SURGERY_CENTER): Payer: 59 | Admitting: Internal Medicine

## 2018-06-06 VITALS — BP 123/71 | HR 69 | Temp 97.8°F | Resp 14 | Ht 62.0 in | Wt 142.0 lb

## 2018-06-06 DIAGNOSIS — K222 Esophageal obstruction: Secondary | ICD-10-CM

## 2018-06-06 DIAGNOSIS — K210 Gastro-esophageal reflux disease with esophagitis: Secondary | ICD-10-CM

## 2018-06-06 DIAGNOSIS — K228 Other specified diseases of esophagus: Secondary | ICD-10-CM

## 2018-06-06 MED ORDER — SODIUM CHLORIDE 0.9 % IV SOLN: 500.0000 mL | Freq: Once | INTRAVENOUS | Status: DC

## 2018-06-06 NOTE — Patient Instructions (Signed)
YOU HAD AN ENDOSCOPIC PROCEDURE TODAY AT [facility]:   Refer to the procedure report that was given to you for any specific questions about what was found during the examination.  If the procedure report does not answer your questions, please call your gastroenterologist to clarify.  If you requested that your care partner not be given the details of your procedure findings, then the procedure report has been included in a sealed envelope for you to review at your convenience later.  YOU SHOULD EXPECT: Some feelings of bloating in the abdomen. Passage of more gas than usual.  Walking can help get rid of the air that was put into your GI tract during the procedure and reduce the bloating. If you had a lower endoscopy (such as a colonoscopy or flexible sigmoidoscopy) you may notice spotting of blood in your stool or on the toilet paper. If you underwent a bowel prep for your procedure, you may not have a normal bowel movement for a few days.  Please Note:  You might notice some irritation and congestion in your nose or some drainage.  This is from the oxygen used during your procedure.  There is no need for concern and it should clear up in a day or so.  SYMPTOMS TO REPORT IMMEDIATELY:    Following upper endoscopy (EGD)  Vomiting of blood or coffee ground material  New chest pain or pain under the shoulder blades  Painful or persistently difficult swallowing  New shortness of breath  Fever of 100F or higher  Black, tarry-looking stools  For urgent or emergent issues, a gastroenterologist can be reached at any hour by calling [phone].   DIET:  Please follow the dilatation diet the rest of today.  Handout was given to your care partner.  Drink plenty of fluids but you should avoid alcoholic beverages for 24 hours.  ACTIVITY:  You should plan to take it easy for the rest of today and you should NOT DRIVE or use heavy machinery until tomorrow (because of the sedation  medicines used during the test).    FOLLOW UP: Our staff will call the number listed on your records the next business day following your procedure to check on you and address any questions or concerns that you may have regarding the information given to you following your procedure. If we do not reach you, we will leave a message.  However, if you are feeling well and you are not experiencing any problems, there is no need to return our call.  We will assume that you have returned to your regular daily activities without incident.  If any biopsies were taken you will be contacted by phone or by letter within the next 1-3 weeks.  Please call us at [phone] if you have not heard about the biopsies in 3 weeks.    SIGNATURES/CONFIDENTIALITY: You and/or your care partner have signed paperwork which will be entered into your electronic medical record.  These signatures attest to the fact that that the information above on your After Visit Summary has been reviewed and is understood.  Full responsibility of the confidentiality of this discharge information lies with you and/or your care-partner.    Handouts were given to your care partner on Esophagitis with stricture and The Dilatation Diet to follow the rest of today. You may resume your current medications today. Await biopsy results. Please call if any questions or concerns.

## 2018-06-06 NOTE — Progress Notes (Signed)
Also removed pt's IV asap at pt's request.  She has some anxiety re: IV.  Pt said everything was fine after IV was removed. maw

## 2018-06-06 NOTE — Op Note (Signed)
[facility] Patient Name: Rachael Richardson Procedure Date: 06/06/2018 10:25 AM MRN: [id number] Endoscopist: Jerene Bears , MD Age: 47 Referring MD:  Date of Birth: 10/20/1970 Gender: Female Account #: [id number] Procedure:                Upper GI endoscopy Indications:              Dysphagia, history of esophageal food impaction in                            March 2019 requiring upper endoscopy Medicines:                Monitored Anesthesia Care Procedure:                Pre-Anesthesia Assessment:                           - Prior to the procedure, a History and Physical                            was performed, and patient medications and                            allergies were reviewed. The patient's tolerance of                            previous anesthesia was also reviewed. The risks                            and benefits of the procedure and the sedation                            options and risks were discussed with the patient.                            All questions were answered, and informed consent                            was obtained. Prior Anticoagulants: The patient has                            taken no previous anticoagulant or antiplatelet                            agents. ASA Grade Assessment: II - A patient with                            mild systemic disease. After reviewing the risks                            and benefits, the patient was deemed in                            satisfactory condition to undergo the procedure.  After obtaining informed consent, the endoscope was                            passed under direct vision. Throughout the                            procedure, the patient's blood pressure, pulse, and                            oxygen saturations were monitored continuously. The                            Model GIF-HQ190 [id number]) scope was introduced                            through the  mouth, and advanced to the second part                            of duodenum. The upper GI endoscopy was                            accomplished without difficulty. The patient                            tolerated the procedure well. Scope In: Scope Out: Findings:                 Mucosal changes including longitudinal furrows and                            white specks were found in the entire esophagus.                            Biopsies were obtained from the proximal and distal                            esophagus with cold forceps for histology of                            possible eosinophilic esophagitis.                           One benign-appearing, intrinsic moderate                            (circumferential scarring or stenosis; an endoscope                            may pass) stenosis was found 35 cm from the                            incisors. This stenosis measured less than one cm                            (in length). The stenosis  was traversed. A TTS                            dilator was passed through the scope. Dilation with                            a 16-17-18 mm balloon dilator was performed to 16                            mm. The dilation site was examined and showed                            moderate mucosal disruption. Estimated blood loss                            was minimal.                           The entire examined stomach was normal.                           The examined duodenum was normal. Complications:            No immediate complications. Estimated Blood Loss:     Estimated blood loss was minimal. Impression:               - Esophageal mucosal changes suspicious for                            eosinophilic esophagitis. Biopsied.                           - Benign-appearing esophageal stenosis. Dilated to                            16 mm with balloon.                           - Normal stomach.                           - Normal  examined duodenum. Recommendation:           - Patient has a contact number available for                            emergencies. The signs and symptoms of potential                            delayed complications were discussed with the                            patient. Return to normal activities tomorrow.                            Written discharge instructions were provided to the  patient.                           - Resume previous diet.                           - Continue present medications.                           - Await pathology results.                           - Repeat upper endoscopy PRN for retreatment. Jerene Bears, MD 06/06/2018 10:46:17 AM This report has been signed electronically.

## 2018-06-06 NOTE — Progress Notes (Signed)
PT taken to PACU. Monitors in place. VSS. Report given to RN. 

## 2018-06-06 NOTE — Progress Notes (Signed)
No problems noted in the recovery room. maw 

## 2018-06-06 NOTE — Progress Notes (Signed)
Called to room to assist during endoscopic procedure.  Patient ID and intended procedure confirmed with present staff. Received instructions for my participation in the procedure from the performing physician.  

## 2018-06-07 ENCOUNTER — Telehealth: Payer: Self-pay

## 2018-06-07 ENCOUNTER — Telehealth: Payer: Self-pay | Admitting: *Deleted

## 2018-06-07 NOTE — Telephone Encounter (Signed)
Attempted to reach patient for post-procedure f/u call. No answer. Left message that we will make another attempt to reach her again later today and for her to please not hesitate to call us if she has any concerns.

## 2018-06-07 NOTE — Telephone Encounter (Signed)
No answer, second call left message to call if questions or concerns

## 2018-06-10 ENCOUNTER — Encounter: Payer: 59 | Admitting: Internal Medicine

## 2018-06-20 ENCOUNTER — Other Ambulatory Visit: Payer: Self-pay

## 2018-06-20 MED ORDER — PANTOPRAZOLE SODIUM 40 MG PO TBEC: 40.0000 mg | DELAYED_RELEASE_TABLET | Freq: Every day | ORAL | 3 refills | Status: AC

## 2020-03-27 ENCOUNTER — Telehealth: Payer: Self-pay | Admitting: Adult Health

## 2020-03-27 NOTE — Telephone Encounter (Signed)
Called patient to review a referral we received on her behalf to screen for eligibility for [place] treatment with [facility].  The patient's phone went directly to voice mail and her voice mail has not yet been set up.    She does not have my chart.    Wilber Bihari, NP

## 2020-11-21 ENCOUNTER — Ambulatory Visit: Payer: 59 | Admitting: Physician Assistant

## 2020-11-21 ENCOUNTER — Encounter: Payer: Self-pay | Admitting: Physician Assistant

## 2020-11-21 ENCOUNTER — Other Ambulatory Visit: Payer: Self-pay

## 2020-11-21 DIAGNOSIS — D485 Neoplasm of uncertain behavior of skin: Secondary | ICD-10-CM

## 2020-11-21 DIAGNOSIS — L821 Other seborrheic keratosis: Secondary | ICD-10-CM | POA: Diagnosis not present

## 2020-11-21 DIAGNOSIS — Z1283 Encounter for screening for malignant neoplasm of skin: Secondary | ICD-10-CM | POA: Diagnosis not present

## 2020-11-21 DIAGNOSIS — D1800 Hemangioma unspecified site: Secondary | ICD-10-CM

## 2020-11-21 DIAGNOSIS — L814 Other melanin hyperpigmentation: Secondary | ICD-10-CM | POA: Diagnosis not present

## 2020-11-21 DIAGNOSIS — L570 Actinic keratosis: Secondary | ICD-10-CM | POA: Diagnosis not present

## 2020-11-21 DIAGNOSIS — L578 Other skin changes due to chronic exposure to nonionizing radiation: Secondary | ICD-10-CM

## 2020-11-21 NOTE — Progress Notes (Addendum)
   New Patient   Subjective  Rachael Richardson is a 50 y.o. female who presents for the following: Annual Exam (Full body skin exam. Per patient there is a blackhead on her nose that just wont go away. No new concerns. Patient has history of multiple atypical moles and sup bcc on left lower back. no family history of atypical moles, melanomas, or skin cancer. ).   The following portions of the chart were reviewed this encounter and updated as appropriate:  Tobacco  Allergies  Meds  Problems  Med Hx  Surg Hx  Fam Hx      Objective  Well appearing patient in no apparent distress; mood and affect are within normal limits.  A full examination was performed including scalp, head, eyes, ears, nose, lips, neck, chest, axillae, abdomen, back, buttocks, bilateral upper extremities, bilateral lower extremities, hands, feet, fingers, toes, fingernails, and toenails. All findings within normal limits unless otherwise noted below.  Right Nasal Sidewall        Assessment & Plan  Neoplasm of uncertain behavior of skin Right Nasal Sidewall  Skin / nail biopsy Type of biopsy: punch   Informed consent: discussed and consent obtained   Timeout: patient name, date of birth, surgical site, and procedure verified   Procedure prep:  Patient was prepped and draped in usual sterile fashion (Non sterile) Prep type:  Chlorhexidine Anesthesia: the lesion was anesthetized in a standard fashion   Anesthetic:  1% lidocaine w/ epinephrine 1-100,000 local infiltration Punch size:  2 mm Suture size:  5-0 Suture type: nylon   Suture removal (days):  7 Hemostasis achieved with: suture   Outcome: patient tolerated procedure well   Additional details:  1 suture  Specimen 1 - Surgical pathology Differential Diagnosis: R/O pore of winer  Check Margins: No   Lentigines - Scattered tan macules - Discussed due to sun exposure - Benign, observe - Call for any changes  Seborrheic Keratoses -  Stuck-on, waxy, tan-brown papules and plaques  - Discussed benign etiology and prognosis. - Observe - Call for any changes  Hemangiomas - Red papules - Discussed benign nature - Observe - Call for any changes  Actinic Damage - diffuse scaly erythematous macules with underlying dyspigmentation - Recommend daily broad spectrum sunscreen SPF 30+ to sun-exposed areas, reapply every 2 hours as needed.  - Call for new or changing lesions.  Skin cancer screening performed today.  I, Michaeljames Milnes, PA-C, have reviewed all documentation's for this visit.  The documentation on 11/21/20 for the exam, diagnosis, procedures and orders are all accurate and complete.

## 2020-11-21 NOTE — Addendum Note (Signed)
Addended by: Robyne Askew R on: 11/21/2020 10:06 AM   Modules accepted: Level of Service

## 2020-11-21 NOTE — Patient Instructions (Signed)

## 2020-11-28 ENCOUNTER — Ambulatory Visit (INDEPENDENT_AMBULATORY_CARE_PROVIDER_SITE_OTHER): Payer: 59 | Admitting: *Deleted

## 2020-11-28 ENCOUNTER — Other Ambulatory Visit: Payer: Self-pay

## 2020-11-28 DIAGNOSIS — Z4802 Encounter for removal of sutures: Secondary | ICD-10-CM

## 2020-11-28 NOTE — Progress Notes (Signed)
Here for suture removal x 1- no sign or symptom of infection- pathology not back yet so told patient to call us next week for appointment.

## 2020-12-02 ENCOUNTER — Telehealth: Payer: Self-pay | Admitting: *Deleted

## 2020-12-02 NOTE — Telephone Encounter (Signed)
Pathology to patient- 6 month recheck scheduled.

## 2020-12-02 NOTE — Telephone Encounter (Signed)
-----   Message from Warren Danes, [place] sent at 11/28/2020  4:27 PM EDT ----- Recheck 6 mo.

## 2021-05-13 ENCOUNTER — Other Ambulatory Visit: Payer: Self-pay

## 2021-05-13 ENCOUNTER — Ambulatory Visit: Payer: 59 | Admitting: Physician Assistant

## 2021-05-13 ENCOUNTER — Encounter: Payer: Self-pay | Admitting: Physician Assistant

## 2021-05-13 DIAGNOSIS — L570 Actinic keratosis: Secondary | ICD-10-CM | POA: Diagnosis not present

## 2021-05-13 DIAGNOSIS — Z86018 Personal history of other benign neoplasm: Secondary | ICD-10-CM

## 2021-05-13 DIAGNOSIS — D485 Neoplasm of uncertain behavior of skin: Secondary | ICD-10-CM

## 2021-05-13 DIAGNOSIS — Z87898 Personal history of other specified conditions: Secondary | ICD-10-CM

## 2021-05-13 NOTE — Patient Instructions (Signed)

## 2021-05-14 ENCOUNTER — Telehealth: Payer: Self-pay | Admitting: Physician Assistant

## 2021-05-14 ENCOUNTER — Encounter: Payer: Self-pay | Admitting: Physician Assistant

## 2021-05-14 NOTE — Telephone Encounter (Signed)
Patient left message on office voice mail that there was a medication on her list of medications that was not hers, Pantoprazal Sodium, and she wanted it taken off her list of medications.  Patient was called back and told it would have to be done by the prescribing physician, Zenovia Jarred, M.D.

## 2021-05-14 NOTE — Progress Notes (Signed)
   Follow-Up Visit   Subjective  Rachael Richardson is a 50 y.o. female who presents for the following: Follow-up (6 month follow up). Per patient lesion is still lumpy.   The following portions of the chart were reviewed this encounter and updated as appropriate:  Tobacco  Allergies  Meds  Problems  Med Hx  Surg Hx  Fam Hx      Objective  Well appearing patient in no apparent distress; mood and affect are within normal limits.  All skin waist up examined.  Dorsum of Nose 6 month follow up on the nose. Scar is clear.   Right Nasal Sidewall Hyperkeratotic scale with brown base    Assessment & Plan  History of atypical nevus Dorsum of Nose  observe  Neoplasm of uncertain behavior of skin Right Nasal Sidewall  Skin / nail biopsy Type of biopsy: tangential   Informed consent: discussed and consent obtained   Timeout: patient name, date of birth, surgical site, and procedure verified   Anesthesia: the lesion was anesthetized in a standard fashion   Anesthetic:  1% lidocaine w/ epinephrine 1-100,000 local infiltration Instrument used: flexible razor blade   Hemostasis achieved with: aluminum chloride and electrodesiccation   Outcome: patient tolerated procedure well   Post-procedure details: sterile dressing applied and wound care instructions given   Dressing type: bandage and petrolatum    Specimen 1 - Surgical pathology Differential Diagnosis: ak/solar lentigo - cautery after biopsy [id number]  Check Margins: yes    I, Govanni Plemons, PA-C, have reviewed all documentation's for this visit.  The documentation on 05/14/21 for the exam, diagnosis, procedures and orders are all accurate and complete.

## 2021-06-17 ENCOUNTER — Telehealth: Payer: Self-pay | Admitting: Physician Assistant

## 2021-06-17 NOTE — Telephone Encounter (Signed)
Phone call to patient with her pathology results. Voicemail left for patient to give the office a call back.  

## 2021-06-17 NOTE — Telephone Encounter (Signed)
Results

## 2021-06-17 NOTE — Telephone Encounter (Signed)
Patient is calling for pathology results from last visit with Kelli Sheffield, PA-C. 

## 2021-06-18 NOTE — Telephone Encounter (Signed)
Pathology results to patient.

## 2021-08-21 ENCOUNTER — Ambulatory Visit: Payer: 59 | Admitting: Physician Assistant

## 2021-11-25 ENCOUNTER — Ambulatory Visit: Payer: 59 | Admitting: Physician Assistant

## 2021-11-25 ENCOUNTER — Encounter: Payer: Self-pay | Admitting: Physician Assistant

## 2021-11-25 DIAGNOSIS — D485 Neoplasm of uncertain behavior of skin: Secondary | ICD-10-CM | POA: Diagnosis not present

## 2021-11-25 DIAGNOSIS — Z86018 Personal history of other benign neoplasm: Secondary | ICD-10-CM | POA: Diagnosis not present

## 2021-11-25 DIAGNOSIS — Z1283 Encounter for screening for malignant neoplasm of skin: Secondary | ICD-10-CM

## 2021-11-25 DIAGNOSIS — Z85828 Personal history of other malignant neoplasm of skin: Secondary | ICD-10-CM

## 2021-11-25 DIAGNOSIS — L570 Actinic keratosis: Secondary | ICD-10-CM

## 2021-11-25 NOTE — Progress Notes (Signed)
? ?  Follow-Up Visit ?  ?Subjective  ?Rachael Richardson is a 52 y.o. female who presents for the following: Annual Exam (Left hip raised painful lesion no drainage x days. Left cheek dark lesion that's getting larger x years and a previous biopsy on the nose AK 05/2021. Personal history of bcc and atypia ). ? ? ?The following portions of the chart were reviewed this encounter and updated as appropriate:  Tobacco  Allergies  Meds  Problems  Med Hx  Surg Hx  Fam Hx   ?  ? ?Objective  ?Well appearing patient in no apparent distress; mood and affect are within normal limits. ? ?A full examination was performed including scalp, head, eyes, ears, nose, lips, neck, chest, axillae, abdomen, back, buttocks, bilateral upper extremities, bilateral lower extremities, hands, feet, fingers, toes, fingernails, and toenails. All findings within normal limits unless otherwise noted below. ? ?No atypical nevi or signs of NMSC noted at the time of the visit.  ? ?Right Nasal Sidewall ?Erythematous patches with gritty scale. ? ?Left Cheek ?Black macule ? ? ?Assessment & Plan  ?Encounter for screening for malignant neoplasm of skin ? ?Yearly skin examination  ? ?AK (actinic keratosis) ?Right Nasal Sidewall ? ?Destruction of lesion - Right Nasal Sidewall ?Complexity: simple   ?Destruction method: cryotherapy   ?Informed consent: discussed and consent obtained   ?Timeout:  patient name, date of birth, surgical site, and procedure verified ?Lesion destroyed using liquid nitrogen: Yes   ?Cryotherapy cycles:  3 ?Outcome: patient tolerated procedure well with no complications   ? ?Neoplasm of uncertain behavior of skin ?Left Cheek ? ?Skin / nail biopsy ?Type of biopsy: punch   ?Informed consent: discussed and consent obtained   ?Timeout: patient name, date of birth, surgical site, and procedure verified   ?Procedure prep:  Patient was prepped and draped in usual sterile fashion (Non sterile) ?Prep type:  Chlorhexidine ?Anesthesia: the  lesion was anesthetized in a standard fashion   ?Anesthetic:  1% lidocaine w/ epinephrine 1-100,000 local infiltration ?Punch size:  4 mm ?Suture size:  5-0 ?Suture type: nylon   ?Suture removal (days):  8 ?Hemostasis achieved with: suture   ?Outcome: patient tolerated procedure well   ?Additional details:  5-0 nylon x 2 ? ?Specimen 1 - Surgical pathology ?Differential Diagnosis: r/o atypia ? ?Check Margins: No ? ? ? ?I, Harika Laidlaw, PA-C, have reviewed all documentation's for this visit.  The documentation on 11/25/21 for the exam, diagnosis, procedures and orders are all accurate and complete. ?

## 2021-11-25 NOTE — Patient Instructions (Signed)

## 2021-12-02 ENCOUNTER — Ambulatory Visit (INDEPENDENT_AMBULATORY_CARE_PROVIDER_SITE_OTHER): Payer: 59 | Admitting: *Deleted

## 2021-12-02 DIAGNOSIS — Z4802 Encounter for removal of sutures: Secondary | ICD-10-CM

## 2021-12-02 NOTE — Progress Notes (Signed)
Here for suture removal left cheek x 2- no sign or symptoms of infection- pathology to patient.  ?

## 2021-12-03 ENCOUNTER — Ambulatory Visit: Payer: 59

## 2022-05-27 ENCOUNTER — Ambulatory Visit: Payer: 59 | Admitting: Physician Assistant
